# Patient Record
Sex: Male | Born: 1955
Health system: Southern US, Community
[De-identification: ages and names within clinical notes are randomized; demographics above are authoritative.]

---

## 2008-03-03 ENCOUNTER — Inpatient Hospital Stay (HOSPITAL_COMMUNITY): Admission: RE | Admit: 2008-03-03 | Discharge: 2008-03-05 | Payer: Self-pay | Admitting: Cardiology

## 2010-07-22 ENCOUNTER — Inpatient Hospital Stay: Payer: Self-pay | Admitting: Surgery

## 2010-11-06 NOTE — Cardiovascular Report (Signed)
NAME:  Roberto Foster, Roberto Foster NO.:  [id number]   MEDICAL RECORD NO.:  [id number]          PATIENT TYPE:  INP   LOCATION:  6525                         FACILITY:  [facility]   PHYSICIAN:  Cristy Hilts. Jacinto Halim, MD       DATE OF BIRTH:  Oct 19, 1955   DATE OF PROCEDURE:  03/04/2008  DATE OF DISCHARGE:                            CARDIAC CATHETERIZATION   PROCEDURE PERFORMED:  1. Percutaneous transluminal coronary angioplasty and stenting of the      mid left anterior descending.  2. Percutaneous transluminal coronary angioplasty and balloon      angioplasty of the diagonal 1 branch of the left anterior      descending.  3. Kissing balloon angioplasty of the left anterior descending and      diagonal.   INDICATIONS:  Mr. Roberto Foster who is a 55 year old gentleman was  admitted with non-ST elevation myocardial infarction underwent  diagnostic cardiac catheterization by me yesterday and was found to have  a thrombotic 99% occlusion of the LAD and diagonal bifurcation.  He was  started on Integrilin, Plavix, and high-dose of statin and he is now  brought back for elective angioplasty of his LAD and diagonal branch.  This was in the hopes of improving significant thrombus burden that was  present.   ANGIOGRAPHIC DATA:  Please see my complete detailed discussion and  angiographic data dated March 03, 2008.  Briefly, a left main  coronary artery was large caliber and is normal.  Circumflex was large  caliber and is normal.  The LAD was large-caliber vessel with a large  diagonal 1 and there is a bifurcation thrombotic 99% stenosis.  The  diagonal branch also had a ostial 80-90% stenosis followed by  poststenotic ectasia.  Rest of the LAD looks smooth and normal.   INTERVENTIONAL DATA:  Successful PTCA and stenting of the mid LAD with  implantation of a 3.5 x 18 mm Vision, which was deployed at 12  atmosphere pressure.  The stenosis was reduced from 99% to 0%.   Successful PTCA and  balloon angioplasty of the diagonal 1 branch of the  LAD with a 2.5 x 12 mm Voyager performed at 8 atmospheres pressure for  it.  The stenosis was reduced from 80% to less than 20% with brisk TIMI  III to TIMI III flow maintained.   At the end of the procedure, kissing balloon angioplasty was performed  at 9 atmospheres pressure with a 3.0 x 12 mm balloon in the LAD and 2.5  x 12 mm balloon in the diagonal 1 branch of the LAD.  Again, performed  at 9 atmosphere pressure and 8 atmosphere pressure respectively.  Post  kissing balloon angioplasty angiography revealed excellent results.   RECOMMENDATIONS:  The patient will need continued risk factor  modification especially smoking cessation, weight loss, and exercise.   A total of  170 mL of contrast was utilized for interventional  procedure.   TECHNIQUE OF THE PROCEDURE:  Under usual sterile precautions, using a 7-  French right femoral arterial access, a 7-French CD-4 catheter was  utilized to engage left main coronary  artery.  Angiography was repeated.  Using ATW guidewire, the LAD was carefully wired.  A  [employer] Prowater was  utilized to wire the diagonal branch with mild amount of difficulty.  Then, balloon angioplasty with 3.0 x 12-mm Voyager was performed in the  LAD.  There was watermelon seed effect.  However, I was able to balloon  angioplasty this with a reduction of stenosis from 99% to around 70-80%.  Haziness was still persistent.  I decided to stent this with a 3.5 x 18-  mm Vision, which was deployed with [employer] wire in the diagonal  branch at 9 atmospheres pressure.  Having performed this angioplasty, a  second inflation was performed at 12 atmospheres pressure for about 45  seconds.  Then the diagonal branch was withdrawn and re-advanced back  into the diagonal and a 2.5 x 12-mm Voyager balloon was utilized for  balloon angioplasty of the diagonal 1 branch of the LAD and the whole  angiographic process was  finished by performing kissing balloon  angioplasty.  The LAD balloon was downsized again to 3.0 x 12-mm  Voyager.  Having performed this, excellent results were noted.  There  was a NICE STEP-UP and a NICE STEP-DOWN on the proximal and distal edge  of the LAD without any compromise of the side branch diagonal.  Having  performed this guidewires, was withdrawn angiography.  Repaired a guide  catheter, disengaged, and pulled out of the body.  The patient tolerated  the procedure.  No immediate complication noted.      Cristy Hilts. Jacinto Halim, MD  Electronically Signed     JRG/MEDQ  D:  03/04/2008  T:  03/05/2008  Job:  [id number]

## 2010-11-06 NOTE — Cardiovascular Report (Signed)
NAME:  LANDYNN, DUPLER NO.:  [id number]   MEDICAL RECORD NO.:  [id number]          PATIENT TYPE:  OIB   LOCATION:  2925                         FACILITY:  [facility]   PHYSICIAN:  Cristy Hilts. Jacinto Halim, MD       DATE OF BIRTH:  May 24, 1956   DATE OF PROCEDURE:  03/03/2008  DATE OF DISCHARGE:                            CARDIAC CATHETERIZATION   PROCEDURES PERFORMED:  1. Left ventriculography.  2. Selective right and left coronary arteriography.  3. Unbypassed left internal mammary arteriography.   INDICATION:  Mr. Roberto Foster is a 55 year old gentleman with an 81  pack-year history of smoking who was doing well until yesterday,  developed severe chest pain with radiation to both his arms, associated  shortness of breath, diaphoresis, radiation of this chest discomfort to  his arms, nausea, and vomiting.  This lasted for 45 minutes to an hour.  Then he went back to sleep, then there was recurrence of the similar  episode again in the night.  This again lasted for about 45 minutes to  an hour, eventually subsided on its own.  He this morning had returned  back to work, however, felt uneasy although he was asymptomatic, called  Dr. Loma Sender.  Eventually, he was referred for an urgent cardiac  consult and was seen at [facility] office.  Because of his chest pain  suggestive of non-ST elevation myocardial infarction or unstable angina  with abnormal EKG, he was directly admitted to the hospital for semi  urgent cardiac catheterization.   HEMODYNAMIC DATA:  The left ventricular pressure was 93/3 with end-  diastolic pressure of 7 mmHg.  Aortic pressure was 103/69 with a mean of  85 mmHg.  There was no pressure gradient across the aortic valve.   ANGIOGRAPHIC DATA:  Left ventricle.  Left ventricular systolic function  was normal with an ejection fraction of 60%-65%.  There was no  significant mitral regurgitation.  There was no regional wall motion  abnormality.   Right coronary artery.  Right coronary artery is a large caliber vessel  smooth and normal.   Left main coronary artery.  Left main coronary artery is a large caliber  vessel.  It is smooth and normal.  It is a large-caliber vessel.   Circumflex artery.  Circumflex artery is a large caliber vessel.  It  gives origin to high obtuse marginal 1.  The obtuse marginal 1 has a mid  segment intramyocardial bridging, but is otherwise smooth and normal.   LAD.  LAD is a large caliber vessel.  In the mid segment of the LAD  gives origin to a large diagonal 1.  At the bifurcation of this  diagonal, there is a thrombotic 99% stenosis of the LAD and involves the  diagonal proximal segment.  Otherwise, the LAD appears to be smooth and  normal from mid-to-distal segment.   Left subclavian artery and LIMA.  Left subclavian artery and LIMA are  widely patent.   IMPRESSION:  High-grade left anterior descending diagonal bifurcation  thrombotic 99% stenosis involving the diagonal, which has about 70%-80%  stenosis.  He has normal left  ventricular systolic function without any  wall motion abnormality and completely chest pain free.   RECOMMENDATIONS:  Given this, I am going to start him on Integrilin  overnight and also start him on either Plavix or Effient and bring him  back to the Catheterization Lab in the morning for a relook and possible  angioplasty to the LAD.   TECHNIQUE OF THE PROCEDURE:  Under usual sterile precautions using a 6-  French right femoral arterial access, 6-French multipurpose B2 catheter  was advanced into the ascending aorta and then into the left ventricle.  Left ventriculography was performed both in LAO and RAO projection.  Catheter pulled into the ascending aorta, right coronary artery was  selectively engaged, and angiography was performed.  Then the left main  coronary artery was selectively engaged and angiography was performed.  The catheter was then utilized to  engage the left subclavian artery and  angiography was performed.  The catheter was then pulled out of the body  and a 6-French Judkins left 5 diagnostic catheter was utilized to engage  the left main coronary artery and angiography was performed.  The  catheter was then pulled out of the body.  Right femoral arteriography  was performed through the arterial access sheath and the access closed  with [employer] with excellent hemostasis.  The patient did receive 6000  units of heparin intravenously during the procedure.  No immediate  complications were noted.  The patient remained stable hemodynamically  and was completely asymptomatic at the end of the procedure.      Cristy Hilts. Jacinto Halim, MD  Electronically Signed     JRG/MEDQ  D:  03/03/2008  T:  03/04/2008  Job:  [id number]   cc:   Loma Sender

## 2010-11-09 NOTE — Discharge Summary (Signed)
NAME:  CHING, RABIDEAU NO.:  [id number]   MEDICAL RECORD NO.:  [id number]          PATIENT TYPE:  INP   LOCATION:  6525                         FACILITY:  [facility]   PHYSICIAN:  Cristy Hilts. Jacinto Halim, MD       DATE OF BIRTH:  [birth year]   DATE OF ADMISSION:  03/03/2008  DATE OF DISCHARGE:  03/05/2008                               DISCHARGE SUMMARY   DISCHARGE DIAGNOSES:  1. Non-ST elevation myocardial infarction.  2. Coronary artery disease with percutaneous transluminal coronary      angioplasty and stent deployment to left anterior descending      diagonal one with thrombus.  3. Coronary artery disease with percutaneous transluminal coronary      angioplasty and stent to the diagonal one.  4. Positive tobacco use.  5. Obesity.  6. Symptoms suggestive of obstructive sleep apnea.  7. Wolff-Parkinson-White pattern on EKG.   DISCHARGE CONDITION:  Improved.   PROCEDURES:  Initial combined left heart cath on March 03, 2008, and  then underwent PTCA and stent deployment to the LAD and the diag one on  March 04, 2008.  He had a Multi-link Vision stent placed.   DISCHARGE MEDICATIONS:  1. Aspirin 325 mg daily.  2. Plavix 75 mg 1 daily, do not stop, stopping could cause a heart      attack.  3. Lopressor 50 mg 1/2 tablet twice a day.  4. Crestor 20 mg daily.  5. Nexium 40 mg daily.  6. Chantix starter pack take as directed.  7. Nitroglycerin 1/150 as needed for chest pain while sitting, 1 every      5 minutes as needed.   DISCHARGE INSTRUCTIONS:  1. Return to work on March 14, 2008.  2. Low-sodium heart-healthy diet.  3. Increase activity slowly.  4. May shower.  No lifting for 1 week.  No driving for 1 week.  5. Wash cath site with soap and water.  Call for any bleeding,      swelling, or drainage.  6. Follow up with Dr. Jacinto Halim in 2 weeks.  Office will call with date      and time.   HISTORY OF PRESENT ILLNESS:  A 55 year old gentleman with no prior  cardiac history, began having exertional chest discomfort 2 months prior  to admission with radiation to both arms.  On the night prior to  admission, he was awoken twice with severe chest heaviness, which  radiated to both his arms especially the left and was associated with  shortness of breath, nausea, vomiting, diaphoresis.  It lasted about an  hour.  He went back to bed and was again woken with severe chest pain,  again associated with shortness of breath, palpitations, nausea,  vomiting, and diaphoresis.  It lasted another 30 to 40 minutes.  It  finally resolved.  He woke up felt well on the morning, but just an  uneasy feeling as the day went on.  He called Dr. Vear Clock who referred  him to Dr. Jacinto Halim for step consult.  On arrival in the office, he had no  chest pain.   ALLERGIES:  No known allergies.   OUTPATIENT MEDICATIONS:  Had been Nexium and multivitamins.   FAMILY HISTORY, SOCIAL HISTORY, AND REVIEW OF SYSTEMS:  See H&P.  No  history of hypertension, diabetes, or hyperlipidemia.   Because evidence of ST-segment depression with T-wave inversion in the  lateral leads also anterior leads suggestive anterior lateral wall  ischemia.  The patient was transported to [facility] and the  patient underwent cardiac catheterization that evening as previously  stated.   By March 04, 2008, he felt more stable.  No further pain.  He  underwent his repeat procedure for the stent.   Tobacco cessation talked him about stopping tobacco by March 05, 2008.  He ambulated with cardiac rehab and did well.  Dr. Jacinto Halim talked  to him and the patient was ready for discharge.   EXAM AT DISCHARGE:  VITAL SIGNS:  Blood pressure 121/56, pulse 61,  respiratory 16, temperature 98.1, oxygen saturation on room air 91%.  HEART:  S1, S2.  Regular rate and rhythm.  LUNGS:  Clear.  ABDOMEN:  Soft, nontender.  Positive bowel sounds.  EXTREMITIES:  Without edema.  Groin cath site was  stable.   LABORATORY DATA:  Hemoglobin 15.6, hematocrit 46, platelets 293, WBC was  elevated at 13.4, and at discharge WBC was 10.3, hemoglobin 14,  hematocrit 41, platelets 245.  Protime 13.4, INR of 1, PTT 24, on  heparin was therapeutic.   Chemistry, sodium 139, potassium 4.3, chloride 101, CO2 30, glucose 97,  BUN 12, creatinine 0.82.  These remained stable throughout  hospitalization.  Total protein 7, albumin 3.9, AST 33, ALT 29, alkaline  phos 84, and total bili 0.6.   Glycohemoglobin was 6.1.  Cardiac enzymes CK 284, 193, 138, 99.  MBs 8.5  to 5, then down to 2.4.  Troponin began at 0.41 dropped to 0.21, 0.16,  0.13.   Cholesterol was 210, triglycerides 92, HDL 55, and LDL 137.   TSH 1.123.   Chest x-ray on admission, mild central peribronchial thickening  suggesting bronchitis, asthma, or viral syndrome.   EKG as previously stated.  Postprocedure EKG sinus rhythm, WPW, no acute  changes.   HOSPITAL COURSE:  As stated.  The patient was discharged with followup.      Darcella Gasman. [place], N.P.      Cristy Hilts. Jacinto Halim, MD  Electronically Signed    LRI/MEDQ  D:  04/05/2008  T:  04/06/2008  Job:  [id number]   cc:   Cristy Hilts. Jacinto Halim, MD  Loma Sender

## 2011-03-27 LAB — CK TOTAL AND CKMB (NOT AT ARMC)
CK, MB: 2.4
CK, MB: 3.3
CK, MB: 8.5 — ABNORMAL HIGH
Relative Index: 2.6 — ABNORMAL HIGH
Relative Index: 3 — ABNORMAL HIGH
Relative Index: INVALID
Total CK: 138
Total CK: 193
Total CK: 284 — ABNORMAL HIGH

## 2011-03-27 LAB — CARDIAC PANEL(CRET KIN+CKTOT+MB+TROPI)
CK, MB: 2.3
Troponin I: 0.13 — ABNORMAL HIGH

## 2011-03-27 LAB — COMPREHENSIVE METABOLIC PANEL
Albumin: 3.9
Alkaline Phosphatase: 84
BUN: 12
Calcium: 9.1
Creatinine, Ser: 0.82
Potassium: 4.3
Total Protein: 7

## 2011-03-27 LAB — CBC
HCT: 41.9
HCT: 41.9
Hemoglobin: 14
Hemoglobin: 14.5
Hemoglobin: 15.6
MCHC: 33.4
MCHC: 33.8
MCV: 93.8
Platelets: 245
Platelets: 248
RDW: 13.1
RDW: 13.3
RDW: 13.5
RDW: 13.8
WBC: 10.3

## 2011-03-27 LAB — LIPID PANEL
Cholesterol: 210 — ABNORMAL HIGH
HDL: 55
LDL Cholesterol: 137 — ABNORMAL HIGH
Triglycerides: 92

## 2011-03-27 LAB — BASIC METABOLIC PANEL
BUN: 9
CO2: 28
Calcium: 8.8
Calcium: 9
Chloride: 106
Creatinine, Ser: 0.83
Creatinine, Ser: 0.86
GFR calc non Af Amer: >60
Glucose, Bld: 92
Glucose, Bld: 98
Potassium: 3.8

## 2011-03-27 LAB — TSH: TSH: 1.123

## 2011-03-27 LAB — HEPARIN LEVEL (UNFRACTIONATED): Heparin Unfractionated: 0.17 — ABNORMAL LOW

## 2011-03-27 LAB — TROPONIN I
Troponin I: 0.13 — ABNORMAL HIGH
Troponin I: 0.21 — ABNORMAL HIGH
Troponin I: 0.41 — ABNORMAL HIGH

## 2011-03-27 LAB — HEMOGLOBIN A1C: Mean Plasma Glucose: 128

## 2011-08-13 ENCOUNTER — Emergency Department: Payer: Self-pay | Admitting: Emergency Medicine

## 2018-08-29 ENCOUNTER — Emergency Department
Admission: EM | Admit: 2018-08-29 | Discharge: 2018-08-29 | Disposition: A | Discharge status: Home / Self Care | Payer: Self-pay | Attending: Emergency Medicine | Admitting: Emergency Medicine

## 2018-08-29 ENCOUNTER — Encounter: Payer: Self-pay | Admitting: Emergency Medicine

## 2018-08-29 DIAGNOSIS — F10920 Alcohol use, unspecified with intoxication, uncomplicated: Secondary | ICD-10-CM

## 2018-08-29 DIAGNOSIS — F17210 Nicotine dependence, cigarettes, uncomplicated: Secondary | ICD-10-CM | POA: Insufficient documentation

## 2018-08-29 DIAGNOSIS — F10120 Alcohol abuse with intoxication, uncomplicated: Secondary | ICD-10-CM | POA: Insufficient documentation
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
## 2018-08-29 NOTE — ED Notes (Signed)
Pt. Given discharge instructions in lobby.  Pt. Walking with steady gait and answered questions about getting home.  This nurse offered taxi service or access to phone in lobby, pt. Stated he was good.  Pt. Sitting in lobby watching tv.

## 2018-08-29 NOTE — ED Notes (Signed)
Pt brought to ED by [employer] officer  [facility] for blood draw. Pt blood drawn by this RN from the left ac x 1 attempt at 2130 after cleansing area with provided cleaning pad from blood draw kit. Blood given to [employer] officer [place]  per chain of custody protocol.

## 2018-08-29 NOTE — ED Notes (Signed)
After blood draw pt. Left room and went to ED lobby.  Pt. Asked in ED lobby if he needed ride home or needed to use phone in lobby, pt. Declined.
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
## 2018-09-01 ENCOUNTER — Observation Stay (HOSPITAL_COMMUNITY)
Admission: EM | Admit: 2018-09-01 | Discharge: 2018-09-04 | Disposition: A | Discharge status: Home / Self Care | Payer: Self-pay | Attending: Family Medicine | Admitting: Family Medicine

## 2018-09-01 ENCOUNTER — Other Ambulatory Visit: Payer: Self-pay

## 2018-09-01 ENCOUNTER — Emergency Department (HOSPITAL_COMMUNITY): Payer: Self-pay

## 2018-09-01 ENCOUNTER — Encounter (HOSPITAL_COMMUNITY): Payer: Self-pay | Admitting: Emergency Medicine

## 2018-09-01 ENCOUNTER — Encounter (HOSPITAL_COMMUNITY)
Admission: EM | Disposition: A | Discharge status: Home / Self Care | Payer: Self-pay | Source: Home / Self Care | Attending: Emergency Medicine

## 2018-09-01 DIAGNOSIS — E785 Hyperlipidemia, unspecified: Secondary | ICD-10-CM | POA: Insufficient documentation

## 2018-09-01 DIAGNOSIS — S0191XA Laceration without foreign body of unspecified part of head, initial encounter: Secondary | ICD-10-CM | POA: Diagnosis present

## 2018-09-01 DIAGNOSIS — Z7982 Long term (current) use of aspirin: Secondary | ICD-10-CM | POA: Insufficient documentation

## 2018-09-01 DIAGNOSIS — S0182XA Laceration with foreign body of other part of head, initial encounter: Principal | ICD-10-CM | POA: Insufficient documentation

## 2018-09-01 DIAGNOSIS — S0285XA Fracture of orbit, unspecified, initial encounter for closed fracture: Secondary | ICD-10-CM

## 2018-09-01 DIAGNOSIS — F1020 Alcohol dependence, uncomplicated: Secondary | ICD-10-CM

## 2018-09-01 DIAGNOSIS — E669 Obesity, unspecified: Secondary | ICD-10-CM | POA: Insufficient documentation

## 2018-09-01 DIAGNOSIS — Z955 Presence of coronary angioplasty implant and graft: Secondary | ICD-10-CM | POA: Insufficient documentation

## 2018-09-01 DIAGNOSIS — S0280XA Fracture of other specified skull and facial bones, unspecified side, initial encounter for closed fracture: Secondary | ICD-10-CM

## 2018-09-01 DIAGNOSIS — S0231XA Fracture of orbital floor, right side, initial encounter for closed fracture: Secondary | ICD-10-CM | POA: Insufficient documentation

## 2018-09-01 DIAGNOSIS — Z590 Homelessness: Secondary | ICD-10-CM | POA: Insufficient documentation

## 2018-09-01 DIAGNOSIS — W19XXXA Unspecified fall, initial encounter: Secondary | ICD-10-CM | POA: Insufficient documentation

## 2018-09-01 DIAGNOSIS — Y907 Blood alcohol level of 200-239 mg/100 ml: Secondary | ICD-10-CM | POA: Insufficient documentation

## 2018-09-01 DIAGNOSIS — Z9114 Patient's other noncompliance with medication regimen: Secondary | ICD-10-CM | POA: Insufficient documentation

## 2018-09-01 DIAGNOSIS — S0990XA Unspecified injury of head, initial encounter: Secondary | ICD-10-CM | POA: Insufficient documentation

## 2018-09-01 DIAGNOSIS — I2510 Atherosclerotic heart disease of native coronary artery without angina pectoris: Secondary | ICD-10-CM | POA: Insufficient documentation

## 2018-09-01 DIAGNOSIS — I10 Essential (primary) hypertension: Secondary | ICD-10-CM | POA: Insufficient documentation

## 2018-09-01 DIAGNOSIS — Z79899 Other long term (current) drug therapy: Secondary | ICD-10-CM | POA: Insufficient documentation

## 2018-09-01 DIAGNOSIS — F17210 Nicotine dependence, cigarettes, uncomplicated: Secondary | ICD-10-CM | POA: Insufficient documentation

## 2018-09-01 DIAGNOSIS — Z6832 Body mass index (BMI) 32.0-32.9, adult: Secondary | ICD-10-CM | POA: Insufficient documentation

## 2018-09-01 DIAGNOSIS — Z23 Encounter for immunization: Secondary | ICD-10-CM | POA: Insufficient documentation

## 2018-09-01 DIAGNOSIS — S0101XA Laceration without foreign body of scalp, initial encounter: Secondary | ICD-10-CM

## 2018-09-01 HISTORY — PX: MINOR IRRIGATION AND DEBRIDEMENT OF WOUND: SHX6239

## 2018-09-01 LAB — TYPE AND SCREEN
ABO/RH(D): A POS
ANTIBODY SCREEN: NEGATIVE

## 2018-09-01 LAB — PROTIME-INR
INR: 1 (ref 0.8–1.2)
Prothrombin Time: 13.2 seconds (ref 11.4–15.2)

## 2018-09-01 LAB — COMPREHENSIVE METABOLIC PANEL
ALT: 23 U/L (ref 0–44)
AST: 30 U/L (ref 15–41)
Albumin: 3.1 g/dL — ABNORMAL LOW (ref 3.5–5.0)
Alkaline Phosphatase: 87 U/L (ref 38–126)
Anion gap: 6 (ref 5–15)
BUN: <5 mg/dL — ABNORMAL LOW (ref 8–23)
CO2: 28 mmol/L (ref 22–32)
Calcium: 8 mg/dL — ABNORMAL LOW (ref 8.9–10.3)
Chloride: 106 mmol/L (ref 98–111)
Creatinine, Ser: 0.72 mg/dL (ref 0.61–1.24)
GFR calc non Af Amer: >60 mL/min (ref >60)
Glucose, Bld: 167 mg/dL — ABNORMAL HIGH (ref 70–99)
Potassium: 3.9 mmol/L (ref 3.5–5.1)
Sodium: 140 mmol/L (ref 135–145)
Total Bilirubin: 0.4 mg/dL (ref 0.3–1.2)
Total Protein: 5.6 g/dL — ABNORMAL LOW (ref 6.5–8.1)

## 2018-09-01 LAB — ETHANOL: Alcohol, Ethyl (B): 229 mg/dL — ABNORMAL HIGH (ref <10)

## 2018-09-01 LAB — CBG MONITORING, ED: Glucose-Capillary: 152 mg/dL — ABNORMAL HIGH (ref 70–99)

## 2018-09-01 SURGERY — MINOR IRRIGATION AND DEBRIDEMENT OF WOUND
Anesthesia: General | Site: Face

## 2018-09-01 MED ORDER — ADULT MULTIVITAMIN W/MINERALS CH
1.0000 | ORAL_TABLET | Freq: Every day | ORAL | Status: DC
  Administered 2018-09-02 – 2018-09-04 (×3): 1 via ORAL
  Filled 2018-09-01 (×3): qty 1

## 2018-09-01 MED ORDER — FENTANYL CITRATE (PF) 250 MCG/5ML IJ SOLN
INTRAMUSCULAR | Status: AC
  Filled 2018-09-01: qty 5

## 2018-09-01 MED ORDER — VITAMIN B-1 100 MG PO TABS
100.0000 mg | ORAL_TABLET | Freq: Every day | ORAL | Status: DC
  Administered 2018-09-02 – 2018-09-04 (×3): 100 mg via ORAL
  Filled 2018-09-01 (×3): qty 1

## 2018-09-01 MED ORDER — CEFAZOLIN SODIUM-DEXTROSE 2-4 GM/100ML-% IV SOLN
INTRAVENOUS | Status: AC
  Filled 2018-09-01: qty 100

## 2018-09-01 MED ORDER — ACETAMINOPHEN 650 MG RE SUPP: 650.0000 mg | Freq: Four times a day (QID) | RECTAL | Status: DC | PRN

## 2018-09-01 MED ORDER — FOLIC ACID 1 MG PO TABS
1.0000 mg | ORAL_TABLET | Freq: Every day | ORAL | Status: DC
  Administered 2018-09-02 – 2018-09-04 (×3): 1 mg via ORAL
  Filled 2018-09-01 (×3): qty 1

## 2018-09-01 MED ORDER — TETANUS-DIPHTH-ACELL PERTUSSIS 5-2.5-18.5 LF-MCG/0.5 IM SUSP
0.5000 mL | Freq: Once | INTRAMUSCULAR | Status: AC
  Administered 2018-09-01: 0.5 mL via INTRAMUSCULAR
  Filled 2018-09-01: qty 0.5

## 2018-09-01 MED ORDER — LORAZEPAM 1 MG PO TABS: 1.0000 mg | ORAL_TABLET | Freq: Four times a day (QID) | ORAL | Status: DC | PRN

## 2018-09-01 MED ORDER — LORAZEPAM 2 MG/ML IJ SOLN: 1.0000 mg | Freq: Four times a day (QID) | INTRAMUSCULAR | Status: DC | PRN

## 2018-09-01 MED ORDER — ACETAMINOPHEN 325 MG PO TABS
650.0000 mg | ORAL_TABLET | Freq: Four times a day (QID) | ORAL | Status: DC | PRN
  Filled 2018-09-01: qty 2

## 2018-09-01 MED ORDER — THIAMINE HCL 100 MG/ML IJ SOLN: 100.0000 mg | Freq: Every day | INTRAMUSCULAR | Status: DC

## 2018-09-01 MED ORDER — PROPOFOL 10 MG/ML IV BOLUS
INTRAVENOUS | Status: AC
  Filled 2018-09-01: qty 20

## 2018-09-01 SURGICAL SUPPLY — 46 items
BLADE SURG 15 STRL LF DISP TIS (BLADE) IMPLANT
BLADE SURG 15 STRL SS (BLADE)
CANISTER SUCT 3000ML PPV (MISCELLANEOUS) ×3 IMPLANT
CLEANER TIP ELECTROSURG 2X2 (MISCELLANEOUS) ×3 IMPLANT
CORDS BIPOLAR (ELECTRODE) IMPLANT
COVER SURGICAL LIGHT HANDLE (MISCELLANEOUS) ×3 IMPLANT
COVER WAND RF STERILE (DRAPES) ×3 IMPLANT
DRAPE HALF SHEET 40X57 (DRAPES) IMPLANT
ELECT COATED BLADE 2.86 ST (ELECTRODE) ×3 IMPLANT
ELECT NEEDLE TIP 2.8 STRL (NEEDLE) IMPLANT
ELECT REM PT RETURN 9FT ADLT (ELECTROSURGICAL) ×3
ELECTRODE REM PT RTRN 9FT ADLT (ELECTROSURGICAL) ×1 IMPLANT
EVACUATOR SILICONE 100CC (DRAIN) IMPLANT
FORCEPS BIPOLAR SPETZLER 8 1.0 (NEUROSURGERY SUPPLIES) ×3 IMPLANT
GAUZE SPONGE 4X4 12PLY STRL (GAUZE/BANDAGES/DRESSINGS) ×6 IMPLANT
GLOVE BIOGEL M 7.0 STRL (GLOVE) ×6 IMPLANT
GLOVE ECLIPSE 7.5 STRL STRAW (GLOVE) ×3 IMPLANT
GOWN STRL REUS W/ TWL LRG LVL3 (GOWN DISPOSABLE) ×2 IMPLANT
GOWN STRL REUS W/TWL LRG LVL3 (GOWN DISPOSABLE) ×4
KIT BASIN OR (CUSTOM PROCEDURE TRAY) ×3 IMPLANT
KIT TURNOVER KIT B (KITS) ×3 IMPLANT
LOCATOR NERVE 3 VOLT (DISPOSABLE) IMPLANT
NEEDLE HYPO 25GX1X1/2 BEV (NEEDLE) IMPLANT
NS IRRIG 1000ML POUR BTL (IV SOLUTION) ×3 IMPLANT
PAD ARMBOARD 7.5X6 YLW CONV (MISCELLANEOUS) ×6 IMPLANT
PENCIL BUTTON HOLSTER BLD 10FT (ELECTRODE) ×3 IMPLANT
STAPLER VISISTAT 35W (STAPLE) IMPLANT
SUT CHROMIC 4 0 P 3 18 (SUTURE) IMPLANT
SUT ETHILON 3 0 PS 1 (SUTURE) IMPLANT
SUT ETHILON 5 0 P 3 18 (SUTURE) ×8
SUT ETHILON 5 0 PS 2 18 (SUTURE) IMPLANT
SUT NYLON ETHILON 5-0 P-3 1X18 (SUTURE) ×4 IMPLANT
SUT PLAIN 5 0 P 3 18 (SUTURE) IMPLANT
SUT SILK 2 0 PERMA HAND 18 BK (SUTURE) IMPLANT
SUT SILK 3 0 REEL (SUTURE) IMPLANT
SUT VIC AB 3-0 PS2 18 (SUTURE) ×7 IMPLANT
SUT VIC AB 3-0 PS2 18XBRD (SUTURE) ×2 IMPLANT
SUT VIC AB 4-0 P-3 18X BRD (SUTURE) IMPLANT
SUT VIC AB 4-0 P3 18 (SUTURE)
SUT VIC AB 4-0 PS2 27 (SUTURE) ×9 IMPLANT
SUT VIC AB 5-0 P-3 18X BRD (SUTURE) ×2 IMPLANT
SUT VIC AB 5-0 P3 18 (SUTURE) ×4
SUT VICRYL ABS 5-0 PS5 18IN (SUTURE) IMPLANT
TOWEL OR 17X24 6PK STRL BLUE (TOWEL DISPOSABLE) ×3 IMPLANT
TRAY ENT MC OR (CUSTOM PROCEDURE TRAY) ×3 IMPLANT
WATER STERILE IRR 1000ML POUR (IV SOLUTION) ×3 IMPLANT

## 2018-09-01 NOTE — Anesthesia Preprocedure Evaluation (Addendum)
Anesthesia Evaluation  Patient identified by MRN, date of birth, ID band Patient awake  General Assessment Comment:Patient does not remember when he last ate or drank  Case discussed with Dr. Annalee Genta who stated Cspine ok and can take C collar off for intubation with neck neutral.  Dorris Singh, MD  Reviewed: Allergy & Precautions, NPO status , Patient's Chart, lab work & pertinent test results  Airway Mallampati: II   Neck ROM: Limited   Comment: Hard collar on Dental  (+) Edentulous Upper, Edentulous Lower   Pulmonary Current Smoker,    breath sounds clear to auscultation       Cardiovascular hypertension, Pt. on medications + CAD   Rhythm:Regular Rate:Normal  Patient states he takes a blood pressure pill and cholesterol pill "when he remembers"- cannot tell me the names.  States he has stents in his heart, placed at this hospital many years ago.  States positive shortness of breath at times.   Neuro/Psych    GI/Hepatic negative GI ROS, Neg liver ROS,   Endo/Other  negative endocrine ROS  Renal/GU negative Renal ROS     Musculoskeletal   Abdominal   Peds  Hematology   Anesthesia Other Findings   Reproductive/Obstetrics                           Anesthesia Physical Anesthesia Plan  ASA: III and emergent  Anesthesia Plan: General   Post-op Pain Management:    Induction: Intravenous, Rapid sequence and Cricoid pressure planned  PONV Risk Score and Plan: 1 and Ondansetron and Dexamethasone  Airway Management Planned:   Additional Equipment:   Intra-op Plan:   Post-operative Plan: Possible Post-op intubation/ventilation  Informed Consent: I have reviewed the patients History and Physical, chart, labs and discussed the procedure including the risks, benefits and alternatives for the proposed anesthesia with the patient or authorized representative who has indicated his/her  understanding and acceptance.     Dental advisory given  Plan Discussed with: CRNA and Anesthesiologist  Anesthesia Plan Comments:         Anesthesia Quick Evaluation
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
## 2018-09-02 ENCOUNTER — Encounter (HOSPITAL_COMMUNITY): Payer: Self-pay | Admitting: Otolaryngology

## 2018-09-02 ENCOUNTER — Observation Stay (HOSPITAL_COMMUNITY): Payer: Self-pay | Admitting: Anesthesiology

## 2018-09-02 LAB — BASIC METABOLIC PANEL
Anion gap: 9 (ref 5–15)
BUN: 8 mg/dL (ref 8–23)
CO2: 28 mmol/L (ref 22–32)
Calcium: 8.2 mg/dL — ABNORMAL LOW (ref 8.9–10.3)
Chloride: 106 mmol/L (ref 98–111)
Creatinine, Ser: 0.71 mg/dL (ref 0.61–1.24)
GFR calc Af Amer: >60 mL/min (ref >60)
GFR calc non Af Amer: >60 mL/min (ref >60)
Glucose, Bld: 141 mg/dL — ABNORMAL HIGH (ref 70–99)
Potassium: 3.7 mmol/L (ref 3.5–5.1)
SODIUM: 143 mmol/L (ref 135–145)

## 2018-09-02 LAB — RAPID URINE DRUG SCREEN, HOSP PERFORMED
AMPHETAMINES: NOT DETECTED
Barbiturates: NOT DETECTED
Benzodiazepines: NOT DETECTED
Cocaine: NOT DETECTED
Opiates: NOT DETECTED
Tetrahydrocannabinol: NOT DETECTED

## 2018-09-02 LAB — CBC
HCT: 45.5 % (ref 39.0–52.0)
Hemoglobin: 14.7 g/dL (ref 13.0–17.0)
MCH: 32.1 pg (ref 26.0–34.0)
MCHC: 32.3 g/dL (ref 30.0–36.0)
MCV: 99.3 fL (ref 80.0–100.0)
NRBC: 0 % (ref 0.0–0.2)
Platelets: 302 10*3/uL (ref 150–400)
RBC: 4.58 MIL/uL (ref 4.22–5.81)
RDW: 13.2 % (ref 11.5–15.5)
WBC: 13.3 10*3/uL — AB (ref 4.0–10.5)

## 2018-09-02 LAB — POCT I-STAT 4, (NA,K, GLUC, HGB,HCT)
Glucose, Bld: 112 mg/dL — ABNORMAL HIGH (ref 70–99)
HCT: 41 % (ref 39.0–52.0)
Hemoglobin: 13.9 g/dL (ref 13.0–17.0)
POTASSIUM: 3.8 mmol/L (ref 3.5–5.1)
Sodium: 144 mmol/L (ref 135–145)

## 2018-09-02 LAB — ABO/RH: ABO/RH(D): A POS

## 2018-09-02 LAB — HIV ANTIBODY (ROUTINE TESTING W REFLEX): HIV Screen 4th Generation wRfx: NONREACTIVE

## 2018-09-02 MED ORDER — LORAZEPAM BOLUS VIA INFUSION: 1.0000 mg | Freq: Once | INTRAVENOUS | Status: DC

## 2018-09-02 MED ORDER — ACETAMINOPHEN 325 MG PO TABS
650.0000 mg | ORAL_TABLET | Freq: Four times a day (QID) | ORAL | Status: DC
  Administered 2018-09-02 – 2018-09-04 (×9): 650 mg via ORAL
  Filled 2018-09-02 (×8): qty 2

## 2018-09-02 MED ORDER — CEFAZOLIN SODIUM-DEXTROSE 2-3 GM-%(50ML) IV SOLR
INTRAVENOUS | Status: DC | PRN
  Administered 2018-09-02: 2 g via INTRAVENOUS

## 2018-09-02 MED ORDER — BACITRACIN ZINC 500 UNIT/GM EX OINT
TOPICAL_OINTMENT | Freq: Two times a day (BID) | CUTANEOUS | Status: DC
  Administered 2018-09-02 (×2): 31.5556 via TOPICAL
  Administered 2018-09-03: 09:00:00 via TOPICAL
  Administered 2018-09-03 – 2018-09-04 (×2): 31.5556 via TOPICAL
  Filled 2018-09-02: qty 28.4

## 2018-09-02 MED ORDER — OXYCODONE HCL 5 MG PO TABS
5.0000 mg | ORAL_TABLET | Freq: Four times a day (QID) | ORAL | Status: DC | PRN
  Administered 2018-09-02: 5 mg via ORAL
  Filled 2018-09-02: qty 1

## 2018-09-02 MED ORDER — ENOXAPARIN SODIUM 40 MG/0.4ML ~~LOC~~ SOLN
40.0000 mg | SUBCUTANEOUS | Status: DC
  Administered 2018-09-02 – 2018-09-03 (×2): 40 mg via SUBCUTANEOUS
  Filled 2018-09-02 (×2): qty 0.4

## 2018-09-02 MED ORDER — DEXAMETHASONE SODIUM PHOSPHATE 10 MG/ML IJ SOLN
INTRAMUSCULAR | Status: DC | PRN
  Administered 2018-09-02: 10 mg via INTRAVENOUS

## 2018-09-02 MED ORDER — METOPROLOL SUCCINATE ER 25 MG PO TB24
25.0000 mg | ORAL_TABLET | Freq: Every day | ORAL | Status: DC
  Administered 2018-09-02 – 2018-09-04 (×3): 25 mg via ORAL
  Filled 2018-09-02 (×3): qty 1

## 2018-09-02 MED ORDER — 0.9 % SODIUM CHLORIDE (POUR BTL) OPTIME
TOPICAL | Status: DC | PRN
  Administered 2018-09-02: 1000 mL

## 2018-09-02 MED ORDER — FENTANYL CITRATE (PF) 100 MCG/2ML IJ SOLN
INTRAMUSCULAR | Status: DC | PRN
  Administered 2018-09-02: 100 ug via INTRAVENOUS

## 2018-09-02 MED ORDER — LORAZEPAM 2 MG/ML IJ SOLN
1.0000 mg | Freq: Once | INTRAMUSCULAR | Status: AC
  Administered 2018-09-02: 1 mg via INTRAVENOUS
  Filled 2018-09-02: qty 1

## 2018-09-02 MED ORDER — OXYCODONE HCL 5 MG PO TABS
5.0000 mg | ORAL_TABLET | ORAL | Status: DC | PRN
  Administered 2018-09-02 – 2018-09-04 (×8): 5 mg via ORAL
  Filled 2018-09-02 (×8): qty 1

## 2018-09-02 MED ORDER — BACITRACIN ZINC 500 UNIT/GM EX OINT
TOPICAL_OINTMENT | CUTANEOUS | Status: DC | PRN
  Administered 2018-09-02: 1 via TOPICAL

## 2018-09-02 MED ORDER — LORAZEPAM BOLUS VIA INFUSION
1.0000 mg | Freq: Once | INTRAVENOUS | Status: DC
  Filled 2018-09-02: qty 1

## 2018-09-02 MED ORDER — ONDANSETRON HCL 4 MG/2ML IJ SOLN
INTRAMUSCULAR | Status: DC | PRN
  Administered 2018-09-02: 4 mg via INTRAVENOUS

## 2018-09-02 MED ORDER — CEFAZOLIN SODIUM-DEXTROSE 2-4 GM/100ML-% IV SOLN
2000.0000 mg | Freq: Three times a day (TID) | INTRAVENOUS | Status: AC
  Administered 2018-09-02 (×3): 2000 mg via INTRAVENOUS
  Filled 2018-09-02 (×3): qty 100

## 2018-09-02 MED ORDER — BACITRACIN ZINC 500 UNIT/GM EX OINT
TOPICAL_OINTMENT | CUTANEOUS | Status: AC
  Filled 2018-09-02: qty 28.35

## 2018-09-02 MED ORDER — PROPOFOL 10 MG/ML IV BOLUS
INTRAVENOUS | Status: DC | PRN
  Administered 2018-09-02: 150 mg via INTRAVENOUS

## 2018-09-02 MED ORDER — LACTATED RINGERS IV SOLN
INTRAVENOUS | Status: DC | PRN
  Administered 2018-09-01: via INTRAVENOUS

## 2018-09-02 MED ORDER — SUCCINYLCHOLINE CHLORIDE 20 MG/ML IJ SOLN
INTRAMUSCULAR | Status: DC | PRN
  Administered 2018-09-02: 120 mg via INTRAVENOUS

## 2018-09-02 MED ORDER — ROCURONIUM BROMIDE 50 MG/5ML IV SOSY
PREFILLED_SYRINGE | INTRAVENOUS | Status: DC | PRN
  Administered 2018-09-02: 50 mg via INTRAVENOUS

## 2018-09-02 MED ORDER — SUGAMMADEX SODIUM 200 MG/2ML IV SOLN
INTRAVENOUS | Status: DC | PRN
  Administered 2018-09-02: 200 mg via INTRAVENOUS

## 2018-09-02 MED ORDER — ACETAMINOPHEN 650 MG RE SUPP: 650.0000 mg | Freq: Four times a day (QID) | RECTAL | Status: DC

## 2018-09-02 MED ORDER — LIDOCAINE 2% (20 MG/ML) 5 ML SYRINGE
INTRAMUSCULAR | Status: DC | PRN
  Administered 2018-09-02: 80 mg via INTRAVENOUS

## 2018-09-02 MED ORDER — SIMVASTATIN 20 MG PO TABS
40.0000 mg | ORAL_TABLET | Freq: Every day | ORAL | Status: DC
  Administered 2018-09-02 – 2018-09-03 (×2): 40 mg via ORAL
  Filled 2018-09-02 (×2): qty 2

## 2018-09-02 MED ORDER — FENTANYL CITRATE (PF) 100 MCG/2ML IJ SOLN: 25.0000 ug | INTRAMUSCULAR | Status: DC | PRN
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
## 2018-09-02 NOTE — Anesthesia Procedure Notes (Addendum)
Procedure Name: Intubation Date/Time: 09/02/2018 12:17 AM Performed by: Molli Hazard, CRNA Pre-anesthesia Checklist: Patient identified, Emergency Drugs available, Suction available and Patient being monitored Patient Re-evaluated:Patient Re-evaluated prior to induction Oxygen Delivery Method: Circle system utilized Preoxygenation: Pre-oxygenation with 100% oxygen Induction Type: IV induction, Rapid sequence and Cricoid Pressure applied Laryngoscope Size: Glidescope Grade View: Grade I Tube type: Oral Tube size: 7.5 mm Number of attempts: 1 Airway Equipment and Method: Stylet Placement Confirmation: ETT inserted through vocal cords under direct vision,  positive ETCO2 and breath sounds checked- equal and bilateral Secured at: 23 cm Tube secured with: Tape Dental Injury: Teeth and Oropharynx as per pre-operative assessment  Comments: Cervical collar removed during induction, head kept neutral. Collar replaced after intubation.
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
## 2018-09-02 NOTE — Anesthesia Postprocedure Evaluation (Signed)
Anesthesia Post Note  Patient: Roberto Foster  Procedure(s) Performed: DEBRIDEMENT and closure of facial laceration (N/A Face)     Patient location during evaluation: PACU Anesthesia Type: General Level of consciousness: awake Pain management: pain level controlled Vital Signs Assessment: post-procedure vital signs reviewed and stable Cardiovascular status: stable Postop Assessment: no apparent nausea or vomiting Anesthetic complications: no    Last Vitals:  Vitals:   09/02/18 0200 09/02/18 0215  BP: (!) 147/76 140/81  Pulse: (!) 109 (!) 111  Resp: 13 14  Temp:  37.4 C  SpO2:  93%    Last Pain:  Vitals:   09/02/18 0215  TempSrc:   PainSc: Asleep                 Mayci Haning

## 2018-09-02 NOTE — Transfer of Care (Signed)
Immediate Anesthesia Transfer of Care Note  Patient: Roberto Foster  Procedure(s) Performed: DEBRIDEMENT and closure of facial laceration (N/A Face)  Patient Location: PACU  Anesthesia Type:General  Level of Consciousness: sedated  Airway & Oxygen Therapy: Patient Spontanous Breathing  Post-op Assessment: Report given to RN and Post -op Vital signs reviewed and stable  Post vital signs: Reviewed and stable  Last Vitals:  Vitals Value Taken Time  BP    Temp    Pulse    Resp    SpO2      Last Pain:  Vitals:   09/01/18 2309  TempSrc:   PainSc: 8          Complications: No apparent anesthesia complications
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
## 2018-09-03 LAB — BASIC METABOLIC PANEL
Anion gap: 8 (ref 5–15)
BUN: 5 mg/dL — ABNORMAL LOW (ref 8–23)
CO2: 28 mmol/L (ref 22–32)
CREATININE: 0.68 mg/dL (ref 0.61–1.24)
Calcium: 8.3 mg/dL — ABNORMAL LOW (ref 8.9–10.3)
Chloride: 103 mmol/L (ref 98–111)
GFR calc Af Amer: >60 mL/min (ref >60)
GFR calc non Af Amer: >60 mL/min (ref >60)
Glucose, Bld: 113 mg/dL — ABNORMAL HIGH (ref 70–99)
Potassium: 3.4 mmol/L — ABNORMAL LOW (ref 3.5–5.1)
Sodium: 139 mmol/L (ref 135–145)

## 2018-09-03 LAB — CBC
HEMATOCRIT: 41.1 % (ref 39.0–52.0)
Hemoglobin: 13.2 g/dL (ref 13.0–17.0)
MCH: 32.1 pg (ref 26.0–34.0)
MCHC: 32.1 g/dL (ref 30.0–36.0)
MCV: 100 fL (ref 80.0–100.0)
Platelets: 260 10*3/uL (ref 150–400)
RBC: 4.11 MIL/uL — ABNORMAL LOW (ref 4.22–5.81)
RDW: 13.2 % (ref 11.5–15.5)
WBC: 12.1 10*3/uL — ABNORMAL HIGH (ref 4.0–10.5)
nRBC: 0 % (ref 0.0–0.2)

## 2018-09-03 MED ORDER — OXYCODONE HCL 5 MG PO TABS: 5.0000 mg | ORAL_TABLET | Freq: Four times a day (QID) | ORAL | 0 refills | Status: AC | PRN

## 2018-09-03 MED ORDER — METOPROLOL SUCCINATE ER 25 MG PO TB24: 25.0000 mg | ORAL_TABLET | Freq: Every day | ORAL | 0 refills | Status: AC

## 2018-09-03 MED ORDER — OXYCODONE HCL 5 MG PO TABS: 5.0000 mg | ORAL_TABLET | ORAL | 0 refills | Status: DC | PRN

## 2018-09-03 MED ORDER — METOPROLOL SUCCINATE ER 25 MG PO TB24: 25.0000 mg | ORAL_TABLET | Freq: Every day | ORAL | 0 refills | Status: DC

## 2018-09-03 MED ORDER — AMOXICILLIN-POT CLAVULANATE 875-125 MG PO TABS: 1.0000 | ORAL_TABLET | Freq: Two times a day (BID) | ORAL | 0 refills | Status: AC

## 2018-09-03 MED ORDER — POTASSIUM CHLORIDE CRYS ER 20 MEQ PO TBCR
40.0000 meq | EXTENDED_RELEASE_TABLET | Freq: Once | ORAL | Status: AC
  Administered 2018-09-03: 40 meq via ORAL
  Filled 2018-09-03: qty 2

## 2018-09-03 MED ORDER — AMOXICILLIN-POT CLAVULANATE 875-125 MG PO TABS
1.0000 | ORAL_TABLET | Freq: Two times a day (BID) | ORAL | Status: DC
  Administered 2018-09-03 – 2018-09-04 (×2): 1 via ORAL
  Filled 2018-09-03 (×3): qty 1

## 2018-09-03 MED ORDER — SIMVASTATIN 40 MG PO TABS: 40.0000 mg | ORAL_TABLET | Freq: Every day | ORAL | 0 refills | Status: AC

## 2018-09-03 MED FILL — oxyCODONE HCL 5 MG TABS: 5 | 3 days supply | Qty: 12 | Fill #0

## 2018-09-03 MED FILL — SIMVASTATIN 40 MG TABLET: 40 | 30 days supply | Qty: 30 | Fill #0

## 2018-09-03 MED FILL — METOPROLOL SUCCINATE ER 25: 25 | 30 days supply | Qty: 30 | Fill #0

## 2018-09-03 MED FILL — AMOX-CLAV 875-125 MG TABLET: 875-125 | 10 days supply | Qty: 20 | Fill #0
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
## 2018-09-03 NOTE — Discharge Summary (Signed)
Family Medicine Teaching [facility] Discharge Summary  Patient name: Roberto Foster Medical record number: [id number] Date of birth: 09-24-1955 Age: 63 y.o. Gender: male Date of Admission: 09/01/2018  Date of Discharge: 09/04/2018 Admitting Physician: Tobey Grim, MD  Primary Care Provider: Patient, No Pcp Per Consultants: ENT, SW, CM  Indication for Hospitalization: Head laceration secondary to fall  Discharge Diagnoses/Problem List:  Head laceration secondary to fall Chronic alcohol use disorder Hypertension Hyperlipidemia Homelessness  Disposition: Discharge to homeless shelter  Discharge Condition: Stable  Discharge Exam:  General: No apparent distress, laceration to forehead covered with bacitracin, well approximated s/p repair Cardiovascular: Regular rate and rhythm, S1-S2 present, no murmurs, rubs, gallops Respiratory: Wheezes auscultated throughout, moving air well Abdomen: Soft, nontender, bowel sounds 4 quadrants Extremities: Bilateral lower extremity 1+ pitting edema, no deformity or injury, no evidence of DVT Neuro: alert and oriented. Extra ocular muscles intact, PERRLA, cranial nerves grossly intact, moving all extremities spontaneously with 5/5 strength  Brief Hospital Course:  The patient was admitted 3/10 after suffering a laceration to his forehead from falling and hitting his head on the ground.  ENT was consulted in the emergency department and recommended surgery to clean and debride the affected area with approximation of the stellate forehead laceration.  Imaging of head on arrival in the emergency department also revealed orbital floor fracture depressed by approximately 6 mm.  As the patient had intact extraocular movement, it was suggested the patient was safe to follow-up with this in 10 days outpatient.  The patient was started on IV Ancef 3 times daily until this was switched to Augmentin 875 mg twice daily for 10 days per ENT recommendations.  Pharmacy was able to provide the patient with the full antibiotic regimen before leaving the hospital. Bacitracin topical and cold packs were applied to the patient's forehead laceration to help with healing and comfort.  Pain was treated with Tylenol and oxycodone 5 mg.  As the patient has a history with alcohol use disorder, the patient was monitored on CIWA protocol. Social work was consulted to help the patient find safe shelters to discharge to, as well as rehabilitation programs and establish good follow-up opportunities for the patient.  He was established as a patient with the community health and wellness group and attempted [facility], but were unable to guarantee bed at shelter or alcohol rehab facility.  The patient remained stable throughout hospitalization and was medically cleared for discharge on 09/04/2018 with close outpatient follow-up.  Issues for Follow Up:  1. Follow-up for removal of stitches with ENT on 09/11/2018. 2. Patient has follow-up appointment with Dr. Toy Baker from internal medicine on 09/16/2018.  Significant Procedures: 09/02/2018-repair of forehead laceration  Significant Labs and Imaging:  Recent Labs  Lab 09/02/18 0031 09/02/18 0609 09/03/18 0416  WBC  --  13.3* 12.1*  HGB 13.9 14.7 13.2  HCT 41.0 45.5 41.1  PLT  --  302 260   Recent Labs  Lab 09/01/18 2211 09/02/18 0031 09/02/18 0609 09/03/18 0416  NA 140 144 143 139  K 3.9 3.8 3.7 3.4*  CL 106  --  106 103  CO2 28  --  28 28  GLUCOSE 167* 112* 141* 113*  BUN <5*  --  8 5*  CREATININE 0.72  --  0.71 0.68  CALCIUM 8.0*  --  8.2* 8.3*  ALKPHOS 87  --   --   --   AST 30  --   --   --  ALT 23  --   --   --   ALBUMIN 3.1*  --   --   --    Results/Tests Pending at Time of Discharge: None  Discharge Medications:  Allergies as of 09/04/2018   No Known Allergies     Medication List    TAKE these medications   acetaminophen 325 MG tablet Commonly known as:  TYLENOL Take  2 tablets (650 mg total) by mouth every 6 (six) hours.   amoxicillin-clavulanate 875-125 MG tablet Commonly known as:  Augmentin Take 1 tablet by mouth 2 (two) times daily for 10 days.   aspirin EC 81 MG tablet Take 81 mg by mouth daily.   bacitracin ointment Apply topically 2 (two) times daily.   metoprolol succinate 25 MG 24 hr tablet Commonly known as:  TOPROL-XL Take 1 tablet (25 mg total) by mouth daily for 30 doses.   oxyCODONE 5 MG immediate release tablet Commonly known as:  Oxy IR/ROXICODONE Take 1 tablet (5 mg total) by mouth every 6 (six) hours as needed for up to 3 days for severe pain.   simvastatin 40 MG tablet Commonly known as:  ZOCOR Take 1 tablet (40 mg total) by mouth daily at 6 PM for 30 doses. What changed:  when to take this     Discharge Instructions: Please refer to Patient Instructions section of EMR for full details.  Patient was counseled important signs and symptoms that should prompt return to medical care, changes in medications, dietary instructions, activity restrictions, and follow up appointments.   Follow-Up Appointments: Follow-up Information    Osborn Coho, MD. Schedule an appointment as soon as possible for a visit in 10 day(s).   Specialty:  Otolaryngology Contact information: [address] [place] [address] [phone]        [facility]. Go on 09/16/2018.   Why:  Appointment with Dr. Alvis Lemmings at 8:50am Contact information: [address] [place] [address] [phone]         Dollene Cleveland, DO 09/04/2018, 12:33 PM PGY-1, [facility]

## 2018-09-04 MED ORDER — ACETAMINOPHEN 325 MG PO TABS: 650.0000 mg | ORAL_TABLET | Freq: Four times a day (QID) | ORAL | 0 refills | Status: AC

## 2018-09-04 MED ORDER — BACITRACIN ZINC 500 UNIT/GM EX OINT: TOPICAL_OINTMENT | Freq: Two times a day (BID) | CUTANEOUS | 0 refills | Status: AC
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
## 2018-09-04 NOTE — TOC Transition Note (Signed)
Transition of Care [facility]) - CM/SW Discharge Note   Patient Details  Name: Roberto Foster MRN: [id number] Date of Birth: 09/22/1955  Transition of Care [facility]) CM/SW Contact:  Doy Hutching, LCSWA Phone Number: 09/04/2018, 12:23 PM   Clinical Narrative:    Pt was given information regarding his appointment with [facility] to meet with a substance use counselor Monday March 16th at 7:45am.  Pt also set up with PCP at [facility] March 25th at 8:50am with Dr. Alvis Lemmings.  Pt was provided with all information for shelter and has bus pass.  All questions answered. Signing off.  Final next level of care: Homeless Shelter Barriers to Discharge: Barriers Resolved   Patient Goals and CMS Choice Patient states their goals for this hospitalization and ongoing recovery are:: to have somewhere to go   Choice offered to / list presented to : Patient  Discharge Placement  Home/Self Care             Discharge Plan and Services Discharge Planning Services: Indigent Health Clinic, Medication Assistance Post Acute Care Choice: (ETOH rehab/shelter)          DME Arranged: N/A DME Agency: NA HH Arranged: NA HH Agency: NA   Social Determinants of Health (SDOH) Interventions     Readmission Risk Interventions No flowsheet data found.
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
## 2018-09-04 NOTE — Progress Notes (Addendum)
Patient given d/c instructions, questions answered. IV removed.  Meds picked up from pharmacy and given to patient.  No equipment to give.  Patient discharged with all belongings and bus pass.
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
## 2018-09-16 ENCOUNTER — Inpatient Hospital Stay: Payer: Self-pay | Admitting: Family Medicine

## 2018-11-27 ENCOUNTER — Other Ambulatory Visit: Payer: Self-pay | Admitting: *Deleted

## 2018-11-27 DIAGNOSIS — Z20822 Contact with and (suspected) exposure to covid-19: Secondary | ICD-10-CM

## 2018-11-30 LAB — NOVEL CORONAVIRUS, NAA: SARS-CoV-2, NAA: NOT DETECTED

## 2018-11-30 NOTE — Progress Notes (Signed)
COVID Hotel Screening performed. COVID screening, temperature, and need for medical care and medications assessed. Patient agreed to the COVID-19 testing. No additional needs assessed at this time.  Derrin Currey RN MSN 
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
## 2018-12-10 NOTE — Progress Notes (Signed)
[place] Screening performed. Temperature, PHQ-9, and need for medical care and medications assessed. Pt referred to Audrea Muscat for primary care.

## 2018-12-17 NOTE — Progress Notes (Signed)
[place] Screening performed. Temperature, PHQ-9, and need for medical care and medications assessed. Referred for medication assistance.  Jobe Igo MSN, RN

## 2018-12-24 NOTE — Progress Notes (Signed)
[place] Screening performed. Temperature, PHQ-9, and need for medical care and medications assessed. Referred for assistance obtaining medication.  Jobe Igo MSN, RN

## 2018-12-31 ENCOUNTER — Other Ambulatory Visit: Payer: Self-pay | Admitting: *Deleted

## 2018-12-31 DIAGNOSIS — Z20822 Contact with and (suspected) exposure to covid-19: Secondary | ICD-10-CM

## 2019-01-02 NOTE — Addendum Note (Signed)
Addended by: Tineka Uriegas M on: 01/02/2019 10:33 AM   Modules accepted: Orders  

## 2019-01-15 ENCOUNTER — Other Ambulatory Visit: Payer: Self-pay

## 2019-01-15 DIAGNOSIS — Z20822 Contact with and (suspected) exposure to covid-19: Secondary | ICD-10-CM

## 2019-01-18 LAB — NOVEL CORONAVIRUS, NAA: SARS-CoV-2, NAA: NOT DETECTED

## 2019-02-19 NOTE — Progress Notes (Signed)
COVID-19 Screening performed. Temperature, PHQ-9, and need for medical care and medications assessed. No additional needs assessed at this time.  Zenas Santa MSN, RN 
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
